# Patient Record
Sex: Female | Born: 1935 | Race: White | Hispanic: No | Smoking: Never smoker
Health system: Southern US, Community
[De-identification: ages and names within clinical notes are randomized; demographics above are authoritative.]

## PROBLEM LIST (undated history)

## (undated) DIAGNOSIS — F0390 Unspecified dementia without behavioral disturbance: Secondary | ICD-10-CM

## (undated) DIAGNOSIS — F419 Anxiety disorder, unspecified: Secondary | ICD-10-CM

---

## 2020-01-31 ENCOUNTER — Other Ambulatory Visit: Payer: Self-pay

## 2020-01-31 ENCOUNTER — Emergency Department
Admission: EM | Admit: 2020-01-31 | Discharge: 2020-01-31 | Disposition: A | Discharge status: Home / Self Care | Payer: Medicare Other | Attending: Emergency Medicine | Admitting: Emergency Medicine

## 2020-01-31 ENCOUNTER — Encounter: Payer: Self-pay | Admitting: Emergency Medicine

## 2020-01-31 ENCOUNTER — Emergency Department: Payer: Medicare Other

## 2020-01-31 DIAGNOSIS — Z5321 Procedure and treatment not carried out due to patient leaving prior to being seen by health care provider: Secondary | ICD-10-CM | POA: Insufficient documentation

## 2020-01-31 DIAGNOSIS — R079 Chest pain, unspecified: Secondary | ICD-10-CM | POA: Insufficient documentation

## 2020-01-31 HISTORY — DX: Unspecified dementia, unspecified severity, without behavioral disturbance, psychotic disturbance, mood disturbance, and anxiety: F03.90

## 2020-01-31 HISTORY — DX: Anxiety disorder, unspecified: F41.9

## 2020-01-31 LAB — BASIC METABOLIC PANEL
Anion gap: 8 (ref 5–15)
BUN: 14 mg/dL (ref 8–23)
CO2: 27 mmol/L (ref 22–32)
Calcium: 8 mg/dL — ABNORMAL LOW (ref 8.9–10.3)
Chloride: 103 mmol/L (ref 98–111)
Creatinine, Ser: 0.67 mg/dL (ref 0.44–1.00)
GFR, Estimated: >60 mL/min (ref >60)
Glucose, Bld: 156 mg/dL — ABNORMAL HIGH (ref 70–99)
Potassium: 3.1 mmol/L — ABNORMAL LOW (ref 3.5–5.1)
Sodium: 138 mmol/L (ref 135–145)

## 2020-01-31 LAB — CBC
HCT: 33.1 % — ABNORMAL LOW (ref 36.0–46.0)
Hemoglobin: 10.7 g/dL — ABNORMAL LOW (ref 12.0–15.0)
MCH: 29.7 pg (ref 26.0–34.0)
MCHC: 32.3 g/dL (ref 30.0–36.0)
MCV: 91.9 fL (ref 80.0–100.0)
Platelets: 239 10*3/uL (ref 150–400)
RBC: 3.6 MIL/uL — ABNORMAL LOW (ref 3.87–5.11)
RDW: 15.5 % (ref 11.5–15.5)
WBC: 8.6 10*3/uL (ref 4.0–10.5)
nRBC: 0 % (ref 0.0–0.2)

## 2020-01-31 LAB — TROPONIN I (HIGH SENSITIVITY): Troponin I (High Sensitivity): 5 ng/L (ref <18)

## 2020-01-31 NOTE — ED Notes (Signed)
Patient's son, Francene Mcerlean, states the patient is denying any discomfort and would like to go back to [facility]. Mr. Wilinski was informed that we have a bed available, but he declined the bed.

## 2020-01-31 NOTE — ED Notes (Signed)
Pt refuses chest x-ray and denies SOB, fever and chest pain.

## 2020-01-31 NOTE — ED Notes (Signed)
Patient has had several episodes of getting out of the wheelchair and walking around. Patient was easily redirected back to the chair, but wouldn't stay in it. Patient was assisted via wheelchair to the triage bathroom and had a loose, brown BM. A new diaper was placed on the patient. Patient had been incontinent of a small of amount of loose, brown stool. Patient's son is now sitting with the patient in the lobby. Patient is calm at this time. Patient denies any pain.

## 2020-01-31 NOTE — ED Triage Notes (Signed)
Pt arrived via EMS from [facility] where pt c/o left sided chest pain after eating tonight. Pt denies SOB, N/V and sts, "I don't want to be here. It was just indigestion. Im not hurting now."

## 2020-01-31 NOTE — ED Notes (Signed)
Per x-ray tech, patient refused to have chest xray.

## 2020-02-11 ENCOUNTER — Other Ambulatory Visit: Payer: Self-pay

## 2020-02-11 ENCOUNTER — Emergency Department: Payer: Medicare Other

## 2020-02-11 ENCOUNTER — Emergency Department
Admission: EM | Admit: 2020-02-11 | Discharge: 2020-02-11 | Disposition: A | Discharge status: Home / Self Care | Payer: Medicare Other | Attending: Emergency Medicine | Admitting: Emergency Medicine

## 2020-02-11 DIAGNOSIS — F0390 Unspecified dementia without behavioral disturbance: Secondary | ICD-10-CM | POA: Diagnosis not present

## 2020-02-11 DIAGNOSIS — W19XXXA Unspecified fall, initial encounter: Secondary | ICD-10-CM

## 2020-02-11 DIAGNOSIS — S82035A Nondisplaced transverse fracture of left patella, initial encounter for closed fracture: Secondary | ICD-10-CM | POA: Insufficient documentation

## 2020-02-11 DIAGNOSIS — S0990XA Unspecified injury of head, initial encounter: Secondary | ICD-10-CM | POA: Insufficient documentation

## 2020-02-11 DIAGNOSIS — S8992XA Unspecified injury of left lower leg, initial encounter: Secondary | ICD-10-CM | POA: Diagnosis present

## 2020-02-11 DIAGNOSIS — Y92003 Bedroom of unspecified non-institutional (private) residence as the place of occurrence of the external cause: Secondary | ICD-10-CM | POA: Insufficient documentation

## 2020-02-11 DIAGNOSIS — W06XXXA Fall from bed, initial encounter: Secondary | ICD-10-CM | POA: Insufficient documentation

## 2020-02-11 MED ORDER — HYDROCODONE-ACETAMINOPHEN 5-325 MG PO TABS: 1.0000 | ORAL_TABLET | ORAL | 0 refills | Status: AC | PRN

## 2020-02-11 NOTE — ED Notes (Signed)
AAOx3.  Skin warm and dry. NAD.  Back to [facility] with son, Kristi Watson.

## 2020-02-11 NOTE — ED Triage Notes (Signed)
Pt in via EMS from [facility] for mechanical unwitnessed fall. Pt states she tripped and fell. Pt with swelling to left knee and hematoma to back of head.

## 2020-02-11 NOTE — ED Provider Notes (Signed)
[facility] Emergency Department Provider Note   ____________________________________________    I have reviewed the triage vital signs and the nursing notes.   HISTORY  Chief Complaint Fall     HPI Kristi Watson is a 86 y.o. female with a history of dementia who presents from an ridge after a fall.  Patient was getting out of bed lost her balance and fell hitting her head and injuring her left knee.  Patient denies back pain or chest pain.  No abdominal pain.  No nausea or vomiting.  Reportedly not on blood thinner.  No dizziness or neuro deficits.  Reports mild posterior head pain but primarily is concerned about left knee pain and swelling  Past Medical History:  Diagnosis Date  . Anxiety   . Dementia (HCC)     There are no problems to display for this patient.   History reviewed. No pertinent surgical history.  Prior to Admission medications   Medication Sig Start Date End Date Taking? Authorizing Provider  HYDROcodone-acetaminophen (NORCO/VICODIN) 5-325 MG tablet Take 1 tablet by mouth every 4 (four) hours as needed for moderate pain. 02/11/20  Yes Jene Every, MD     Allergies Penicillins  No family history on file.  Social History Social History   Tobacco Use  . Smoking status: Never Smoker  . Smokeless tobacco: Never Used  Substance Use Topics  . Alcohol use: Not Currently  . Drug use: Not Currently    Review of Systems  Constitutional: No fever/chills Eyes: No visual changes.  ENT: No nasal injury Cardiovascular: Denies chest pain. Respiratory: Denies shortness of breath. Gastrointestinal: No abdominal pain.  No nausea, no vomiting.   Genitourinary: Negative for dysuria. Musculoskeletal: As above Skin: Negative for rash. Neurological: Negative for  weakness   ____________________________________________   PHYSICAL EXAM:  VITAL SIGNS: ED Triage Vitals  Enc Vitals Group     BP 02/11/20 0825 (!) 144/73      Pulse Rate 02/11/20 0825 73     Resp 02/11/20 0825 16     Temp 02/11/20 0825 98.3 F (36.8 C)     Temp Source 02/11/20 0825 Oral     SpO2 02/11/20 0825 96 %     Weight 02/11/20 0826 56.7 kg (125 lb)     Height 02/11/20 0826 1.651 m (5\' 5" )     Head Circumference --      Peak Flow --      Pain Score 02/11/20 0825 4     Pain Loc --      Pain Edu? --      Excl. in GC? --     Constitutional: Alert and oriented. No acute distress. Eyes: Conjunctivae are normal.  Head: Small hematoma posterior scalp, not bleeding Nose: No congestion/rhinnorhea. Mouth/Throat: Mucous membranes are moist.   Neck:  Painless ROM, no vertebral tenderness palpation, no pain with axial Cardiovascular: Normal rate, regular rhythm. Grossly normal heart sounds.  Good peripheral circulation.  No chest wall tenderness palpation Respiratory: Normal respiratory effort.  No retractions. Lungs CTAB. Gastrointestinal: Soft and nontender. No distention.    Musculoskeletal: Left knee with significant swelling mild tenderness to the patella.  Warm and well perfused Neurologic:  Normal speech and language. No gross focal neurologic deficits are appreciated.  Skin:  Skin is warm, dry and intact. No rash noted. Psychiatric: Mood and affect are normal. Speech and behavior are normal.  ____________________________________________   LABS (all labs ordered are listed, but only abnormal results are displayed)  Labs Reviewed - No data to display ____________________________________________  EKG  None ____________________________________________  RADIOLOGY  Knee x-ray reviewed by me, displaced fracture of patella CT head without acute abnormality ____________________________________________   PROCEDURES  Procedure(s) performed: No  Procedures   Critical Care performed: No ____________________________________________   INITIAL IMPRESSION / ASSESSMENT AND PLAN / ED COURSE  Pertinent labs & imaging  results that were available during my care of the patient were reviewed by me and considered in my medical decision making (see chart for details).  Patient presents after fall as described above.  CT head is overall reassuring, no neuro deficits, no significant headache.  Left knee with significant swelling, x-ray demonstrates patella fracture, knee immobilizer ordered, appropriate for discharge with outpatient follow-up with orthopedics, instructed patient to use a walker only    ____________________________________________   FINAL CLINICAL IMPRESSION(S) / ED DIAGNOSES  Final diagnoses:  Fall, initial encounter  Closed nondisplaced transverse fracture of left patella, initial encounter  Injury of head, initial encounter        Note:  This document was prepared using Dragon voice recognition software and may include unintentional dictation errors.   Jene Every, MD 02/11/20 1016

## 2020-02-11 NOTE — ED Triage Notes (Signed)
See first nurse note, pt states she was getting up out of bed and lost her balance and fell. Pt has abrasion with swelling to the left knee and hematoma to the back of the head, denies being on blood thinners. Pt is alert on arrival

## 2021-10-21 IMAGING — CR DG KNEE COMPLETE 4+V*L*
4 series · 4 of 4 positions shown · non-contrast
Comparison: None.

CLINICAL DATA: Fall, pain, abrasion and swelling to LEFT knee.

EXAM:
LEFT KNEE - COMPLETE 4+ VIEW

[knee ap]
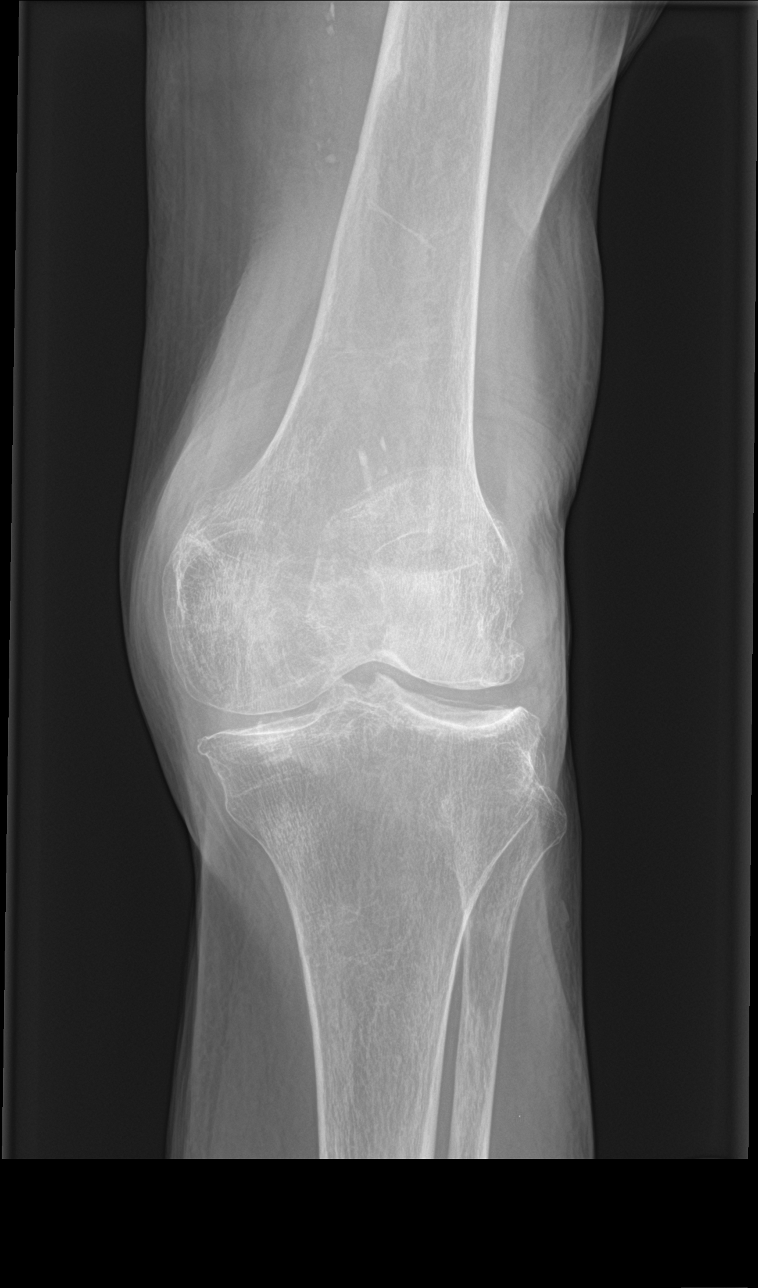

[knee obl (1 of 2)]
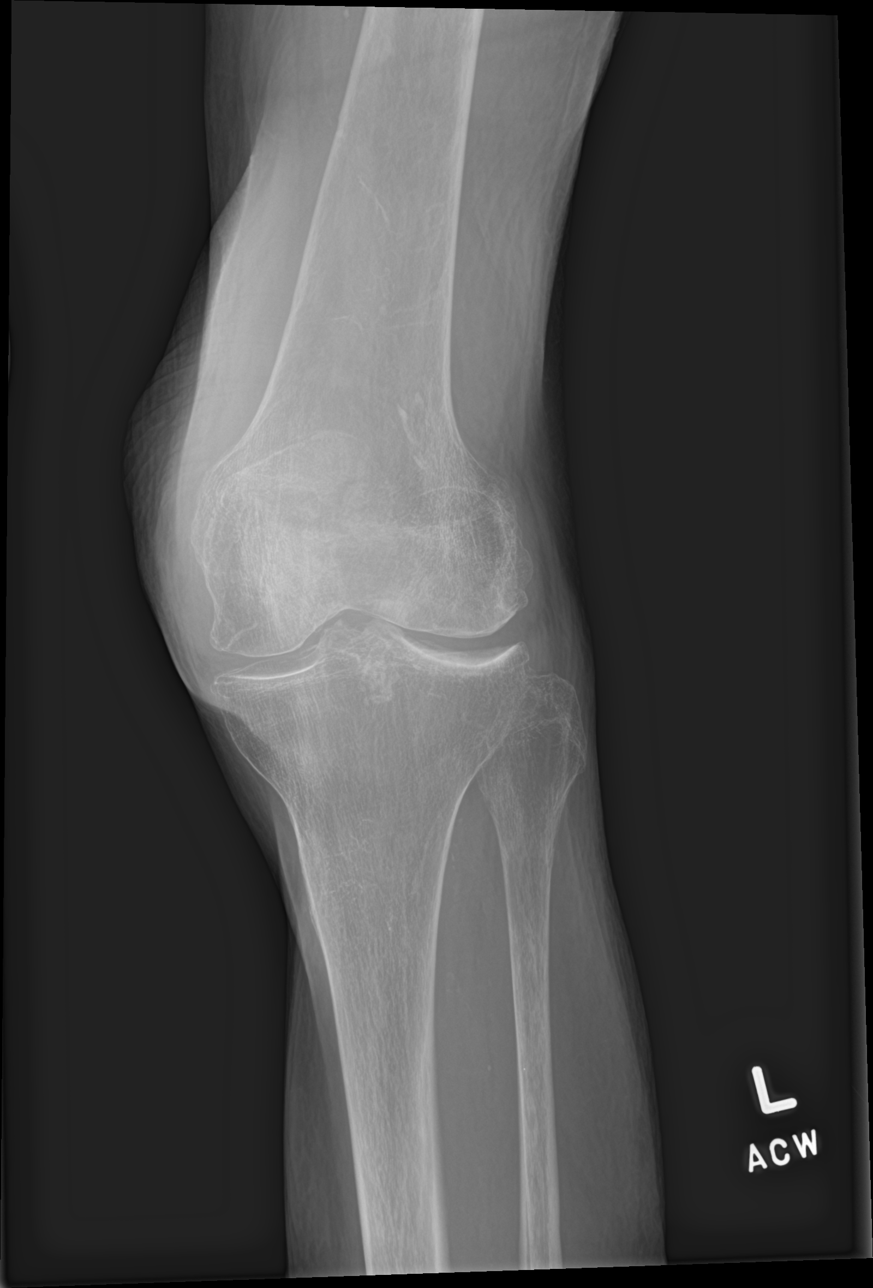

[knee obl (2 of 2)]
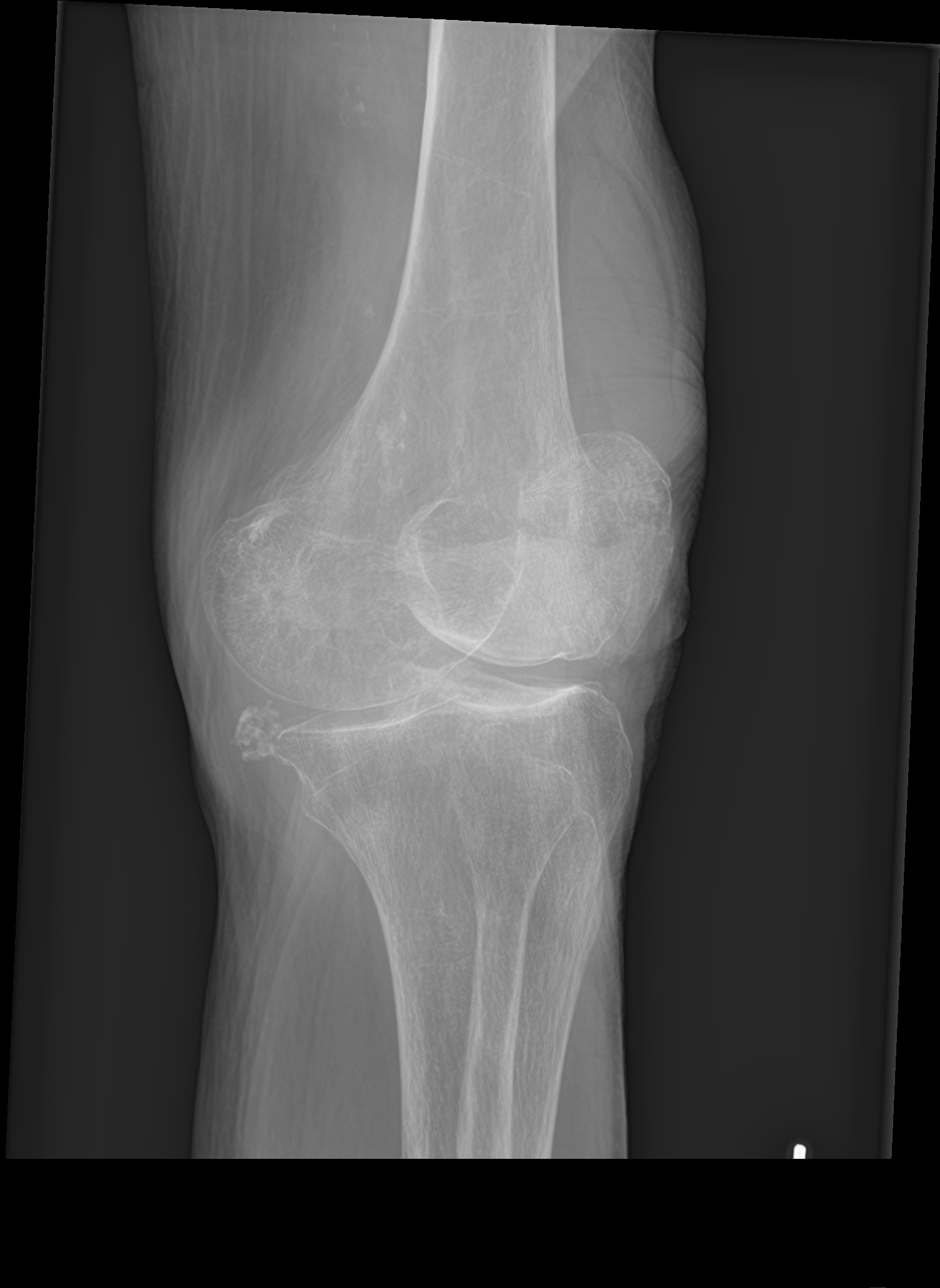

[knee lat]
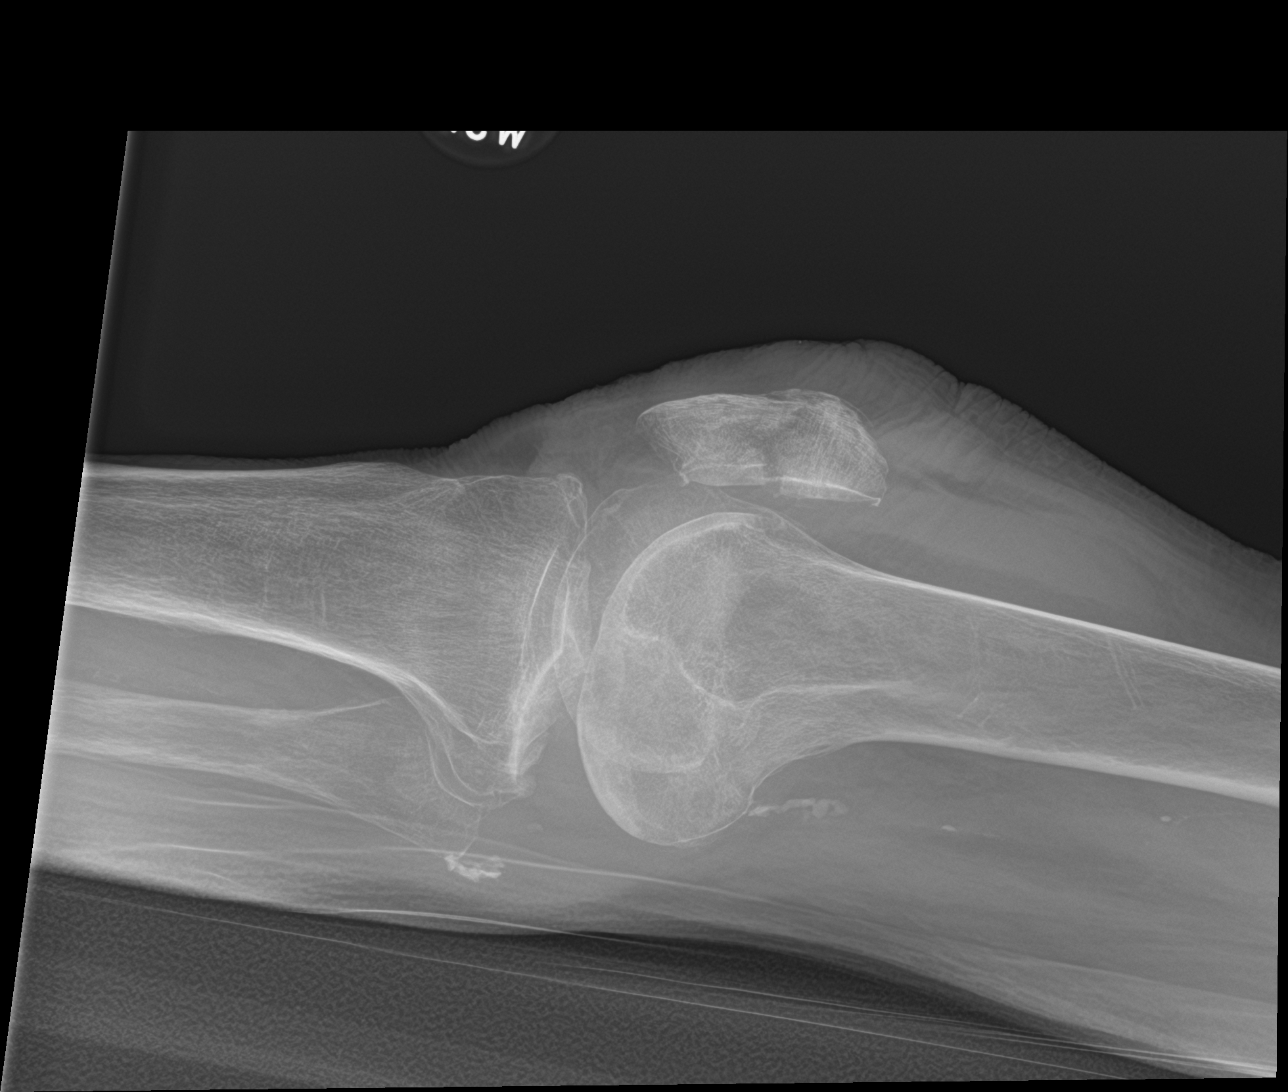

[4 of 4 positions shown; findings below may reference images not displayed]

FINDINGS: Displaced fracture within the mid pole of the patella. Adjacent
joint effusion. Overlying soft tissue swelling.

Distal femur appears intact and normally aligned. Proximal tibia and
fibula appear intact and normally aligned.
IMPRESSION: 1. Acute displaced fracture within the mid pole of the patella.
2. Associated joint effusion and soft tissue swelling.

## 2023-02-23 ENCOUNTER — Encounter (HOSPITAL_COMMUNITY): Payer: Self-pay

## 2023-02-23 ENCOUNTER — Other Ambulatory Visit: Payer: Self-pay

## 2023-02-23 ENCOUNTER — Emergency Department (HOSPITAL_COMMUNITY)
Admission: EM | Admit: 2023-02-23 | Discharge: 2023-02-24 | Disposition: A | Discharge status: Other Acute Inpatient Hospital | Payer: Medicare Other | Attending: Emergency Medicine | Admitting: Emergency Medicine

## 2023-02-23 ENCOUNTER — Emergency Department (HOSPITAL_COMMUNITY): Payer: Medicare Other

## 2023-02-23 DIAGNOSIS — F0390 Unspecified dementia without behavioral disturbance: Secondary | ICD-10-CM | POA: Diagnosis not present

## 2023-02-23 DIAGNOSIS — S0181XA Laceration without foreign body of other part of head, initial encounter: Secondary | ICD-10-CM | POA: Insufficient documentation

## 2023-02-23 DIAGNOSIS — S01112A Laceration without foreign body of left eyelid and periocular area, initial encounter: Secondary | ICD-10-CM | POA: Insufficient documentation

## 2023-02-23 DIAGNOSIS — W19XXXA Unspecified fall, initial encounter: Secondary | ICD-10-CM | POA: Insufficient documentation

## 2023-02-23 LAB — URINALYSIS, ROUTINE W REFLEX MICROSCOPIC
Bilirubin Urine: NEGATIVE
Glucose, UA: NEGATIVE mg/dL
Ketones, ur: NEGATIVE mg/dL
Leukocytes,Ua: NEGATIVE
Nitrite: NEGATIVE
Protein, ur: NEGATIVE mg/dL
Specific Gravity, Urine: 1.013 (ref 1.005–1.030)
pH: 6 (ref 5.0–8.0)

## 2023-02-23 MED ORDER — LIDOCAINE HCL (PF) 1 % IJ SOLN
5.0000 mL | Freq: Once | INTRAMUSCULAR | Status: AC
  Administered 2023-02-23: 5 mL
  Filled 2023-02-23: qty 5

## 2023-02-23 MED ORDER — ACETAMINOPHEN 500 MG PO TABS
1000.0000 mg | ORAL_TABLET | Freq: Four times a day (QID) | ORAL | Status: DC | PRN
  Administered 2023-02-23: 1000 mg via ORAL
  Filled 2023-02-23: qty 2

## 2023-02-23 MED ORDER — CEPHALEXIN 500 MG PO CAPS: 500.0000 mg | ORAL_CAPSULE | Freq: Three times a day (TID) | ORAL | 0 refills | Status: AC

## 2023-02-23 MED ORDER — SERTRALINE HCL 50 MG PO TABS
50.0000 mg | ORAL_TABLET | Freq: Every day | ORAL | Status: DC
  Administered 2023-02-23 – 2023-02-24 (×2): 50 mg via ORAL
  Filled 2023-02-23 (×2): qty 1

## 2023-02-23 MED ORDER — TRAZODONE HCL 50 MG PO TABS
25.0000 mg | ORAL_TABLET | Freq: Every day | ORAL | Status: DC
  Administered 2023-02-23: 25 mg via ORAL
  Filled 2023-02-23: qty 1

## 2023-02-23 MED ORDER — QUETIAPINE FUMARATE 25 MG PO TABS
12.5000 mg | ORAL_TABLET | Freq: Every day | ORAL | Status: DC
  Administered 2023-02-23 – 2023-02-24 (×2): 12.5 mg via ORAL
  Filled 2023-02-23 (×2): qty 1

## 2023-02-23 MED ORDER — ERYTHROMYCIN 5 MG/GM OP OINT
1.0000 | TOPICAL_OINTMENT | Freq: Once | OPHTHALMIC | Status: AC
  Administered 2023-02-23: 1 via OPHTHALMIC
  Filled 2023-02-23: qty 3.5

## 2023-02-23 MED ORDER — MEMANTINE HCL 10 MG PO TABS
10.0000 mg | ORAL_TABLET | Freq: Two times a day (BID) | ORAL | Status: DC
  Administered 2023-02-23 – 2023-02-24 (×2): 10 mg via ORAL
  Filled 2023-02-23 (×2): qty 1

## 2023-02-23 NOTE — ED Triage Notes (Signed)
 Patient is from [facility]

## 2023-02-23 NOTE — Telephone Encounter (Signed)
 89 yo F fell Christmas day with an eyelid laceration. This was repaired in the ED by ophthalmology on 12/25. She fell again today and had re-opening of the laceration. Spoke with Dr. Thaddeus from ophthalmology who is recommending ED to ED transfer for repair by ophthalmology.    []  Page ophthalmology on arrival to ED for repair

## 2023-02-23 NOTE — ED Notes (Signed)
 [facility] refused to pick up pt.

## 2023-02-23 NOTE — ED Notes (Addendum)
 Pt resting quietly in the bed with eyes closed. Chest rise and fall noted. Daughter at the bedside.

## 2023-02-23 NOTE — ED Triage Notes (Signed)
 PT BIB EMS for an unwitnessed fall, patient has a laceration to her forehead and to her left eye. Per EMS, staff state she is at baseline, history of dementia.  110/70 Hr 74 94% RA CBG 195

## 2023-02-23 NOTE — ED Provider Notes (Signed)
 Delay in transportation due to weather.  Discussed with Dr. Tobie, on-call for ophthalmology, recommends wound be cleaned and erythromycin  ointment placed on the wound.  States that this will temporize the wound until she can be transported in the morning.  Family at bedside and informed of plan   Kristi Faden, MD 02/23/23 [phone]

## 2023-02-23 NOTE — ED Provider Notes (Signed)
 [place] EMERGENCY DEPARTMENT AT [facility] Provider Note   CSN: [id number] Arrival date & time: 02/23/23  9287     History  Chief Complaint  Patient presents with   [place]    Kristi Watson is a 89 y.o. female.  89 year old female presents today for concern of fall that occurred at SNF.  This was unwitnessed fall.  Patient is complaining of pain to the front of her head.  She has no other complaints.  She does have history of dementia.  She is oriented to self.  This appears to be her baseline according to chart review.  I did speak with nurse from the SNF.  She states that patient was seen by the CNA 5 minutes prior to when she was discovered to have a fall and she was in her bed at at that point.  She was found to be laying in between the bed and nightstand upon the return of the CNA.  She told the staff that she rolled out of bed.  She has a laceration to her left eyelid, and her forehead.  The history is provided by the patient. No language interpreter was used.       Home Medications Prior to Admission medications   Medication Sig Start Date End Date Taking? Authorizing Provider  HYDROcodone -acetaminophen  (NORCO/VICODIN) 5-325 MG tablet Take 1 tablet by mouth every 4 (four) hours as needed for moderate pain. 02/11/20   Arlander Charleston, MD      Allergies    Penicillins    Review of Systems   Review of Systems  Constitutional:  Negative for fever.  All other systems reviewed and are negative.   Physical Exam Updated Vital Signs BP (!) 155/92   Pulse 73   Temp 98.3 F (36.8 C)   Resp 17   SpO2 100%  Physical Exam Vitals and nursing note reviewed.  Constitutional:      General: She is not in acute distress.    Appearance: Normal appearance. She is not ill-appearing.  HENT:     Head: Normocephalic and atraumatic.     Nose: Nose normal.  Eyes:     Conjunctiva/sclera: Conjunctivae normal.  Cardiovascular:     Rate and Rhythm: Normal rate and  regular rhythm.  Pulmonary:     Effort: Pulmonary effort is normal. No respiratory distress.  Musculoskeletal:        General: No deformity.     Cervical back: Normal range of motion.  Skin:    Findings: No rash.     Comments: Laceration noted to left lower medial eyelid.  There is also a laceration to the left eyebrow.  See attached image for description of eyelid laceration.  Neurological:     Mental Status: She is alert.         ED Results / Procedures / Treatments   Labs (all labs ordered are listed, but only abnormal results are displayed) Labs Reviewed - No data to display  EKG None  Radiology No results found.  Procedures .SABRALac repair [place] Freyre  Date/Time: 02/23/2023 12:10 PM  Performed by: Hildegard Loge, PA-C Authorized by: Hildegard Loge, PA-C   Consent:    Consent obtained:  Verbal   Consent given by:  Patient   Risks discussed:  Need for additional repair, infection, retained foreign body, poor cosmetic result and poor wound healing   Alternatives discussed:  No treatment Universal protocol:    Procedure explained and questions answered to patient or proxy's satisfaction: yes  Relevant documents present and verified: yes     Patient identity confirmed:  Verbally with patient and arm band Laceration details:    Length (cm):  3 Pre-procedure details:    Preparation:  Patient was prepped and draped in usual sterile fashion and imaging obtained to evaluate for foreign bodies Treatment:    Area cleansed with:  Saline and povidone-iodine   Amount of cleaning:  Extensive   Irrigation solution:  Sterile saline   Irrigation volume:  500   Irrigation method:  Tap   Debridement:  None   Undermining:  None Skin repair:    Repair method:  Sutures   Suture size:  5-0   Suture material:  Prolene   Suture technique:  Simple interrupted   Number of sutures:  4 Approximation:    Approximation:  Close Repair type:    Repair type:  Simple Post-procedure details:     Dressing:  Non-adherent dressing   Procedure completion:  Tolerated well, no immediate complications     Medications Ordered in ED Medications - No data to display  ED Course/ Medical Decision Making/ A&P                                 Medical Decision Making Amount and/or Complexity of Data Reviewed Labs: ordered. Radiology: ordered.  Risk Prescription drug management.   Medical Decision Making / ED Course   This patient presents to the ED for concern of fall, this involves an extensive number of treatment options, and is a complaint that carries with it a high risk of complications and morbidity.  The differential diagnosis includes trauma, fracture, intracranial bleed, laceration  MDM: 89 year old female presents today for evaluation following a fall.  Fall appears to be mechanical in nature and not a syncopal episode. She has history of similar falls.  She has a forehead lac and an eyelid lac. See attached image above for eyelid lac description. Discussed with ophthalmology.  Our ophthalmologist is a retinal specialist and is unable to perform lid lac repair.  Discussed with [facility] ophthalmologist.  I spoke with Dr. Nancyann Riding who will accept patient in transfer as an ED to ED.  The logistics team at [facility] discussed with the ED provider and they will also accept patient.  This was discussed with Dr. Leita Chancy at [facility] ED by the [facility] logistics team.  Notified our secretary.  She will work on [employer] transportation.  Family aware and in agreement with plan. All other major joints without TTP. Spine well aligned.  Forehead lac repaired see procedure note.   CT imaging without acute concern for acute pathology.  UA negative for UTI.  Antibiotic prescribed given potential for retained debris within the wound.  Prescription printed at family's request.  Additional history obtained: -Additional history obtained from outside medical records including recent visit to [facility] on  02/07/2023 -External records from outside source obtained and reviewed including: Chart review including previous notes, labs, imaging, consultation notes   Lab Tests: -I ordered, reviewed, and interpreted labs.   The pertinent results include:   Labs Reviewed  URINALYSIS, ROUTINE W REFLEX MICROSCOPIC      EKG  EKG Interpretation Date/Time:    Ventricular Rate:    PR Interval:    QRS Duration:    QT Interval:    QTC Calculation:   R Axis:      Text Interpretation:  Imaging Studies ordered: I ordered imaging studies including CT head, ct cervical, ct orbits I independently visualized and interpreted imaging. I agree with the radiologist interpretation   Medicines ordered and prescription drug management: Meds ordered this encounter  Medications   lidocaine  (PF) (XYLOCAINE ) 1 % injection 5 mL    -I have reviewed the patients home medicines and have made adjustments as needed  Consultations Obtained: I requested consultation with the ophthalmology ,  and discussed lab and imaging findings as well as pertinent plan - they recommend: dr Tobie our on call ophtho, Dr. Thaddeus ophtho at [facility]. As above   Reevaluation: After the interventions noted above, I reevaluated the patient and found that they have :stayed the same  Co morbidities that complicate the patient evaluation  Past Medical History:  Diagnosis Date   Anxiety    Dementia (HCC)       Dispostion: Patient will be transferred to [facility] for eyelid laceration repair.   Final Clinical Impression(s) / ED Diagnoses Final diagnoses:  Fall, initial encounter  Left eyelid laceration, initial encounter  Laceration of forehead, initial encounter    Rx / DC Orders ED Discharge Orders          Ordered    cephALEXin  (KEFLEX ) 500 MG capsule  3 times daily        02/23/23 1215              Hildegard Loge, PA-C 02/23/23 1218    Freddi Hamilton, MD 02/27/23 1504

## 2023-02-23 NOTE — ED Notes (Signed)
 Faxed face sheet to [facility] ed at 11am for pt to be transport to [facility].

## 2023-02-23 NOTE — Discharge Instructions (Signed)
 Your CT imaging was overall reassuring.  No concerning findings.  Your forehead laceration was repaired in the emergency department.  For the eyelid laceration you are being transferred to [facility] emergency department for ophthalmology to repair this at that hospital as we do not have an ophthalmologist that can repair this here today.  Antibiotic sent to the pharmacy to ensure the laceration site does not get infected.

## 2023-02-24 DIAGNOSIS — S0181XA Laceration without foreign body of other part of head, initial encounter: Secondary | ICD-10-CM | POA: Diagnosis not present

## 2023-02-24 DIAGNOSIS — S01112A Laceration without foreign body of left eyelid and periocular area, initial encounter: Secondary | ICD-10-CM | POA: Diagnosis present

## 2023-02-24 DIAGNOSIS — F0390 Unspecified dementia without behavioral disturbance: Secondary | ICD-10-CM | POA: Diagnosis not present

## 2023-02-24 DIAGNOSIS — W19XXXA Unspecified fall, initial encounter: Secondary | ICD-10-CM | POA: Diagnosis not present

## 2023-02-24 NOTE — ED Notes (Signed)
 Called [facility], they [place] not [place] the [place]. Advised to call back about 12/1 for an update on transportation.

## 2023-02-24 NOTE — ED Provider Notes (Signed)
 Emergency Department Provider Note   Assessment and Medical Decision Making    89 y.o. female w/ PMH of Alzheimer disease, Asthma, Dementia, and Osteoporosis who presents as an ED to ED transfer for eyelid laceration after a fall.  Patient with recent eyelid laceration repaired by [facility] ophthalmology on 02/07/2023.  Vitals on presentation are within normal limits.  Physical exam notable for laceration to lower left medial eyelid.  No hyphema.  Visual fields intact finger counting.  EOMI.  Previously.  Laceration to the left eyebrow is intact.  Patient had negative CT head, maxillofacial, and C-spine at outside ED which were negative for acute traumatic injury.  See ED course for continuation of care.  ED Course as of 02/24/23 2116  Sat Feb 24, 2023  1546 Ophthalmology paged  [phone] I spoke with ophthalmology.  They state they have no other eyelid laceration to repair first but will be by to see the patient.  1900 Patient signed out to oncoming provider awaiting ophthalmology repair of eyelid laceration    Discussion of Management with other Physicians, QHP, or Appropriate Source: Discussion with other professionals: Consultant - Ophthalmology: see ED course External Records Reviewed: External ED Not from today notable for negative CT head, maxillofacial, and c-spine imaging  ____________________________________________  The case was discussed with the attending physician, who is in agreement with the above assessment and plan.    History   Chief Complaint  Patient presents with  . Eye Trauma    HPI: Kristi Watson is a pleasant 89 y.o. female w/ PMH of Alzheimer disease, Asthma, Dementia, and Osteoporosis who presents as an ED to ED transfer for eyelid laceration after a fall.  Patient with recent eyelid laceration repaired by [facility] ophthalmology on 02/07/2023.  Patient was seen at an outside ED today for a ground-level fall without syncope.  CT head, maxillofacial, and cervical spine  were obtained and without acute traumatic injury.  Given patient's recent closure of eyelid laceration with reopening the patient was transferred to [facility] for reconsultation of ophthalmology for repeat closure.  Patient denies any complaints at this time.  PMH/PSH: As noted in HPI.  Allergies:  Allergies  Allergen Reactions  . Aspirin Other (See Comments)    UNKNOWN    Social:  Social History   Tobacco Use  . Smoking status: Former    Types: Cigarettes    Passive exposure: Past  . Smokeless tobacco: Never  Vaping Use  . Vaping status: Never Used  Substance Use Topics  . Alcohol use: Not Currently  . Drug use: Not Currently    Physical Exam  BP 122/69   Pulse 89   Temp 37.1 C (98.8 F) (Oral)   Resp 16   SpO2 90%    Physical Exam Vitals and nursing note reviewed.  Constitutional:      Appearance: Normal appearance.  HENT:     Head: Normocephalic and atraumatic.  Eyes:     Conjunctiva/sclera: Conjunctivae normal.     Comments: EOMI.  PERRL.  No hyphema.  Laceration to the lower left medial eyelid involving the border.  Well-approximated and well-healing subacute laceration to the left brow which is intact.  Cardiovascular:     Rate and Rhythm: Normal rate.  Pulmonary:     Effort: Pulmonary effort is normal.  Abdominal:     General: There is no distension.  Musculoskeletal:        General: Normal range of motion.  Skin:    General: Skin is  warm.     Coloration: Skin is not jaundiced or pale.  Neurological:     General: No focal deficit present.     Mental Status: Kristi Watson is alert.  Psychiatric:        Mood and Affect: Mood normal.      Radiology   No orders to display    Labs   Labs Reviewed - No data to display   Lamar Casey, PGY-3 [facility] Emergency Medicine Pager: [phone]     To patients reading this note: Please be advised the primary purpose of this note is to keep track of your care and communicate with other members of your  medical team. Standard sentence structure is not always used. Medical terminology and medical abbreviations may be used. Parts of this note may have been generated using voice dictation software. There may be wrong-word or sound-alike substitution errors and typographical errors missed in proofreading.        Casey Lamar BIRCH, MD Resident 02/24/23 [phone]

## 2023-03-09 NOTE — Progress Notes (Signed)
#   Margin involving laceration of the right lower eyelid:  - Patient had fall on 02/07/23 resulting in ~20 mm complex laceration at the medial eyelid with full-thickness involvement of the eyelid margin and extension of the laceration to the medial canthus. - At that time, canalicular repair could not be completed. Oculoplastics was not available at [facility] at the time. Offered transfer to [facility] but family opted to forego this and instead the laceration was closed.  -02/25/23: Patient fell again and sutured laceration dehisced. Repaired at bedside in [facility] ED.  03/09/23: - Presents as fu s/p laceration repair. 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 Discussed with family that conjunctival exposure can lead to irritation and dryness. However, given patients age and risk/benefit discussion, family opted to observe and treat conservatively. She has good corneal coverage so this is reasonable course of action.       Plan: - Lacrilube ointment BID to left lower lid - Lid push ups  - brow sutures removed today  - RTC 4-6 weeks  (at this point contracture should be complete)   Stephane Larger, MD Ophthalmology, PGY-2  Seen with Dr William

## 2023-09-14 DEATH — deceased
